# Patient Record
Sex: Female | Born: 1970 | Race: White | Hispanic: No | Marital: Single | Smoking: Current every day smoker
Health system: Southern US, Community
[De-identification: ages and names within clinical notes are randomized; demographics above are authoritative.]

## PROBLEM LIST (undated history)

## (undated) DIAGNOSIS — J45909 Unspecified asthma, uncomplicated: Secondary | ICD-10-CM

---

## 2007-04-19 ENCOUNTER — Emergency Department (HOSPITAL_COMMUNITY): Admission: EM | Admit: 2007-04-19 | Discharge: 2007-04-19 | Payer: Self-pay | Admitting: Emergency Medicine

## 2008-03-26 ENCOUNTER — Emergency Department (HOSPITAL_COMMUNITY): Admission: EM | Admit: 2008-03-26 | Discharge: 2008-03-26 | Payer: Self-pay | Admitting: Emergency Medicine

## 2008-04-05 ENCOUNTER — Emergency Department (HOSPITAL_COMMUNITY): Admission: EM | Admit: 2008-04-05 | Discharge: 2008-04-05 | Payer: Self-pay | Admitting: Emergency Medicine

## 2016-02-11 ENCOUNTER — Encounter (HOSPITAL_COMMUNITY): Payer: Self-pay | Admitting: Emergency Medicine

## 2016-02-11 ENCOUNTER — Emergency Department (HOSPITAL_COMMUNITY): Payer: Self-pay

## 2016-02-11 ENCOUNTER — Emergency Department (HOSPITAL_COMMUNITY)
Admission: EM | Admit: 2016-02-11 | Discharge: 2016-02-11 | Disposition: A | Discharge status: Home / Self Care | Payer: Self-pay | Attending: Emergency Medicine | Admitting: Emergency Medicine

## 2016-02-11 DIAGNOSIS — R51 Headache: Secondary | ICD-10-CM | POA: Insufficient documentation

## 2016-02-11 DIAGNOSIS — Y999 Unspecified external cause status: Secondary | ICD-10-CM | POA: Insufficient documentation

## 2016-02-11 DIAGNOSIS — Y9259 Other trade areas as the place of occurrence of the external cause: Secondary | ICD-10-CM | POA: Insufficient documentation

## 2016-02-11 DIAGNOSIS — Z23 Encounter for immunization: Secondary | ICD-10-CM | POA: Insufficient documentation

## 2016-02-11 DIAGNOSIS — R0789 Other chest pain: Secondary | ICD-10-CM | POA: Insufficient documentation

## 2016-02-11 DIAGNOSIS — M79602 Pain in left arm: Secondary | ICD-10-CM

## 2016-02-11 DIAGNOSIS — F17210 Nicotine dependence, cigarettes, uncomplicated: Secondary | ICD-10-CM | POA: Insufficient documentation

## 2016-02-11 DIAGNOSIS — R519 Headache, unspecified: Secondary | ICD-10-CM

## 2016-02-11 DIAGNOSIS — M542 Cervicalgia: Secondary | ICD-10-CM | POA: Insufficient documentation

## 2016-02-11 DIAGNOSIS — R109 Unspecified abdominal pain: Secondary | ICD-10-CM | POA: Insufficient documentation

## 2016-02-11 DIAGNOSIS — S0181XA Laceration without foreign body of other part of head, initial encounter: Secondary | ICD-10-CM | POA: Insufficient documentation

## 2016-02-11 DIAGNOSIS — S5012XA Contusion of left forearm, initial encounter: Secondary | ICD-10-CM | POA: Insufficient documentation

## 2016-02-11 DIAGNOSIS — Y939 Activity, unspecified: Secondary | ICD-10-CM | POA: Insufficient documentation

## 2016-02-11 HISTORY — DX: Unspecified asthma, uncomplicated: J45.909

## 2016-02-11 LAB — CBC WITH DIFFERENTIAL/PLATELET
BASOS ABS: 0.1 10*3/uL (ref 0.0–0.1)
BASOS PCT: 1 %
EOS ABS: 0.1 10*3/uL (ref 0.0–0.7)
Eosinophils Relative: 1 %
HEMATOCRIT: 37.6 % (ref 36.0–46.0)
HEMOGLOBIN: 12.7 g/dL (ref 12.0–15.0)
Lymphocytes Relative: 17 %
Lymphs Abs: 1.8 10*3/uL (ref 0.7–4.0)
MCH: 32.4 pg (ref 26.0–34.0)
MCHC: 33.8 g/dL (ref 30.0–36.0)
MCV: 95.9 fL (ref 78.0–100.0)
MONO ABS: 0.5 10*3/uL (ref 0.1–1.0)
Monocytes Relative: 5 %
NEUTROS ABS: 8 10*3/uL — AB (ref 1.7–7.7)
NEUTROS PCT: 76 %
Platelets: 188 10*3/uL (ref 150–400)
RBC: 3.92 MIL/uL (ref 3.87–5.11)
RDW: 13.4 % (ref 11.5–15.5)
WBC: 10.5 10*3/uL (ref 4.0–10.5)

## 2016-02-11 LAB — I-STAT CHEM 8, ED
BUN: 8 mg/dL (ref 6–20)
CREATININE: 0.7 mg/dL (ref 0.44–1.00)
Calcium, Ion: 1.09 mmol/L — ABNORMAL LOW (ref 1.13–1.30)
Chloride: 100 mmol/L — ABNORMAL LOW (ref 101–111)
Glucose, Bld: 117 mg/dL — ABNORMAL HIGH (ref 65–99)
HEMATOCRIT: 39 % (ref 36.0–46.0)
HEMOGLOBIN: 13.3 g/dL (ref 12.0–15.0)
POTASSIUM: 3.2 mmol/L — AB (ref 3.5–5.1)
SODIUM: 138 mmol/L (ref 135–145)
TCO2: 25 mmol/L (ref 0–100)

## 2016-02-11 LAB — RAPID URINE DRUG SCREEN, HOSP PERFORMED
Amphetamines: NOT DETECTED
Barbiturates: NOT DETECTED
Benzodiazepines: NOT DETECTED
COCAINE: POSITIVE — AB
OPIATES: NOT DETECTED
TETRAHYDROCANNABINOL: POSITIVE — AB

## 2016-02-11 LAB — I-STAT BETA HCG BLOOD, ED (MC, WL, AP ONLY)

## 2016-02-11 LAB — ETHANOL

## 2016-02-11 MED ORDER — TETANUS-DIPHTH-ACELL PERTUSSIS 5-2.5-18.5 LF-MCG/0.5 IM SUSP
0.5000 mL | Freq: Once | INTRAMUSCULAR | Status: AC
  Administered 2016-02-11: 0.5 mL via INTRAMUSCULAR
  Filled 2016-02-11: qty 0.5

## 2016-02-11 MED ORDER — IOPAMIDOL (ISOVUE-300) INJECTION 61%
INTRAVENOUS | Status: AC
  Administered 2016-02-11: 80 mL
  Filled 2016-02-11: qty 100
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
## 2016-02-11 NOTE — ED Notes (Signed)
Patient transported to CT 
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
## 2016-02-11 NOTE — ED Notes (Signed)
Patient stated that she was unable to tolerate theromometer orally or axillary.

## 2016-02-11 NOTE — Discharge Instructions (Signed)
Your x-rays and CT scans were normal today.  Your laceration was closed with glue.  This will come off when it is ready.  Do not use drugs.  Follow-up with your doctor.
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
